# Patient Record
Sex: Female | Born: 1945 | Race: White | Hispanic: No
Health system: Southern US, Community
[De-identification: ages and names within clinical notes are randomized; demographics above are authoritative.]

---

## 2009-01-31 ENCOUNTER — Ambulatory Visit (HOSPITAL_COMMUNITY): Admission: RE | Admit: 2009-01-31 | Discharge: 2009-01-31 | Payer: Self-pay | Admitting: Interventional Radiology

## 2009-02-14 ENCOUNTER — Inpatient Hospital Stay (HOSPITAL_COMMUNITY): Admission: RE | Admit: 2009-02-14 | Discharge: 2009-02-16 | Payer: Self-pay | Admitting: Interventional Radiology

## 2009-02-28 ENCOUNTER — Encounter: Payer: Self-pay | Admitting: Interventional Radiology

## 2009-08-13 ENCOUNTER — Ambulatory Visit (HOSPITAL_COMMUNITY): Admission: RE | Admit: 2009-08-13 | Discharge: 2009-08-13 | Payer: Self-pay | Admitting: Interventional Radiology

## 2010-08-05 IMAGING — CR DG CHEST 2V
2 series · 2 of 2 positions shown · non-contrast
Comparison: None

CLINICAL DATA: Cerebral artery malformation, pre coiling.  Cough.
Hypertension.

CHEST - 2 VIEW

[view not recorded (1 of 2)]
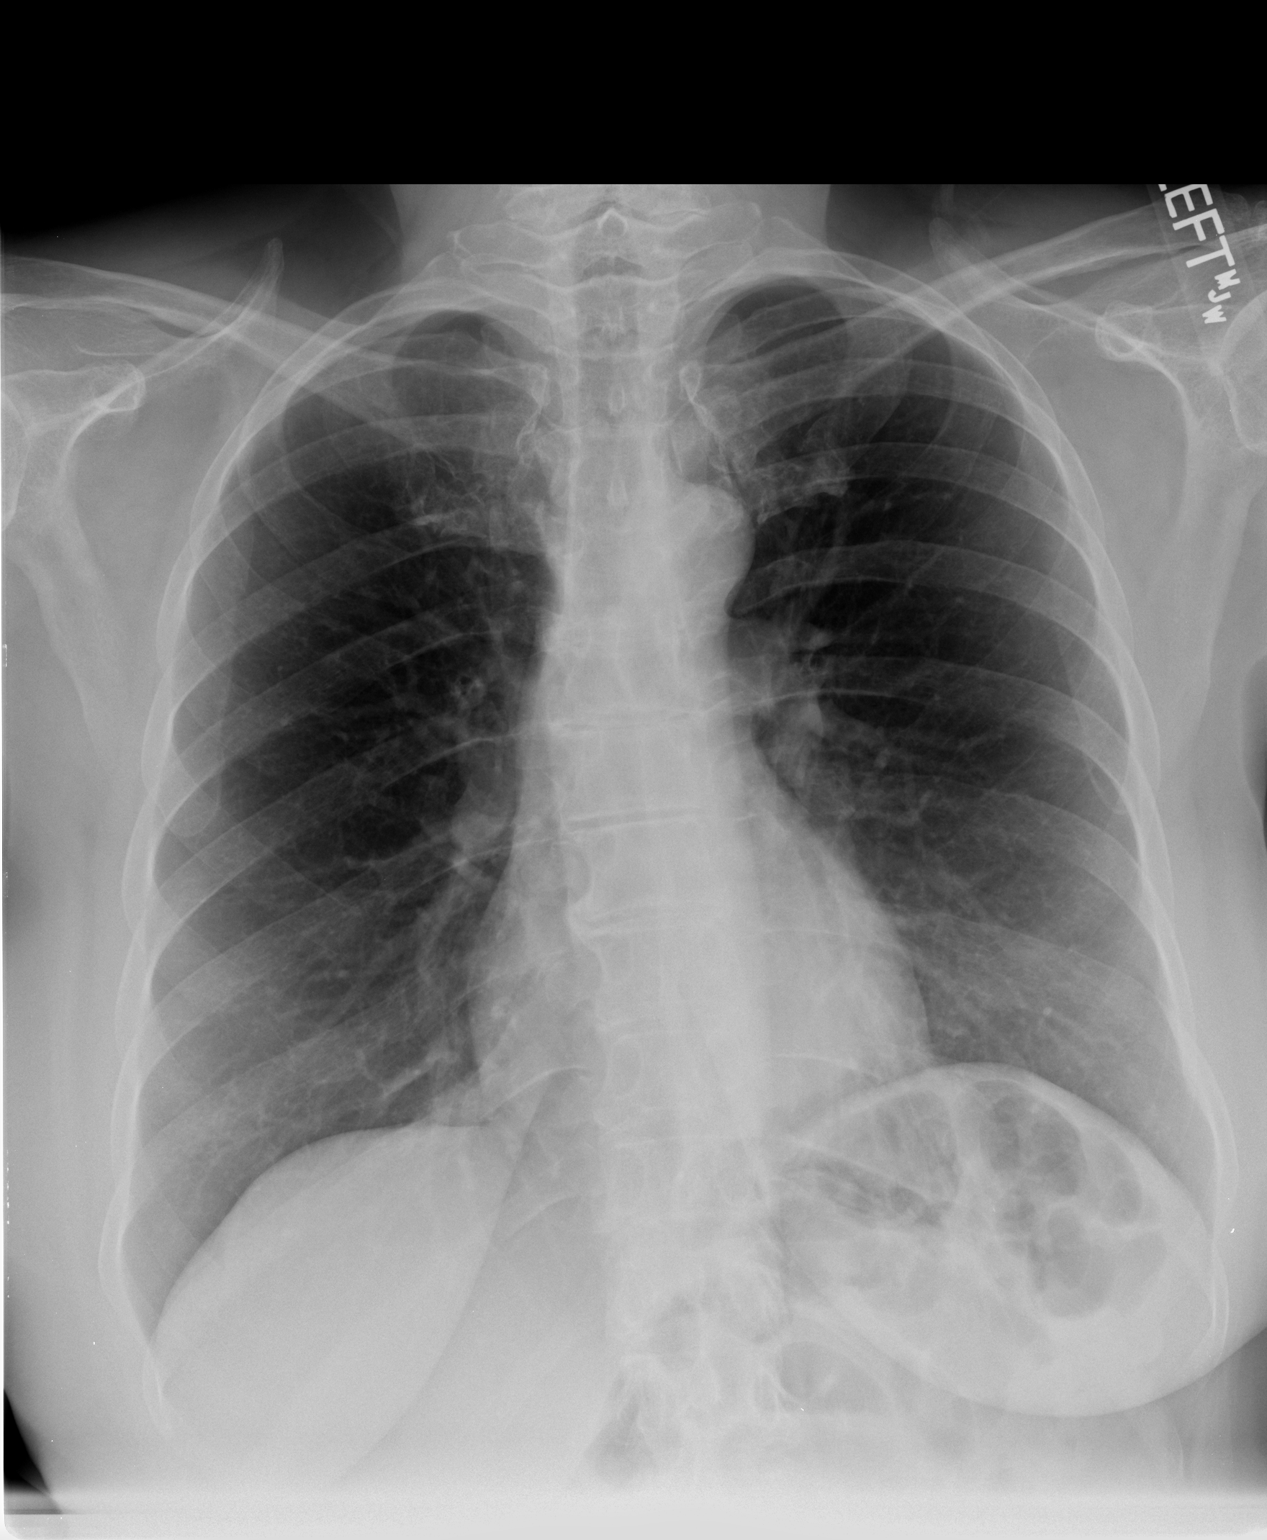

[view not recorded (2 of 2)]
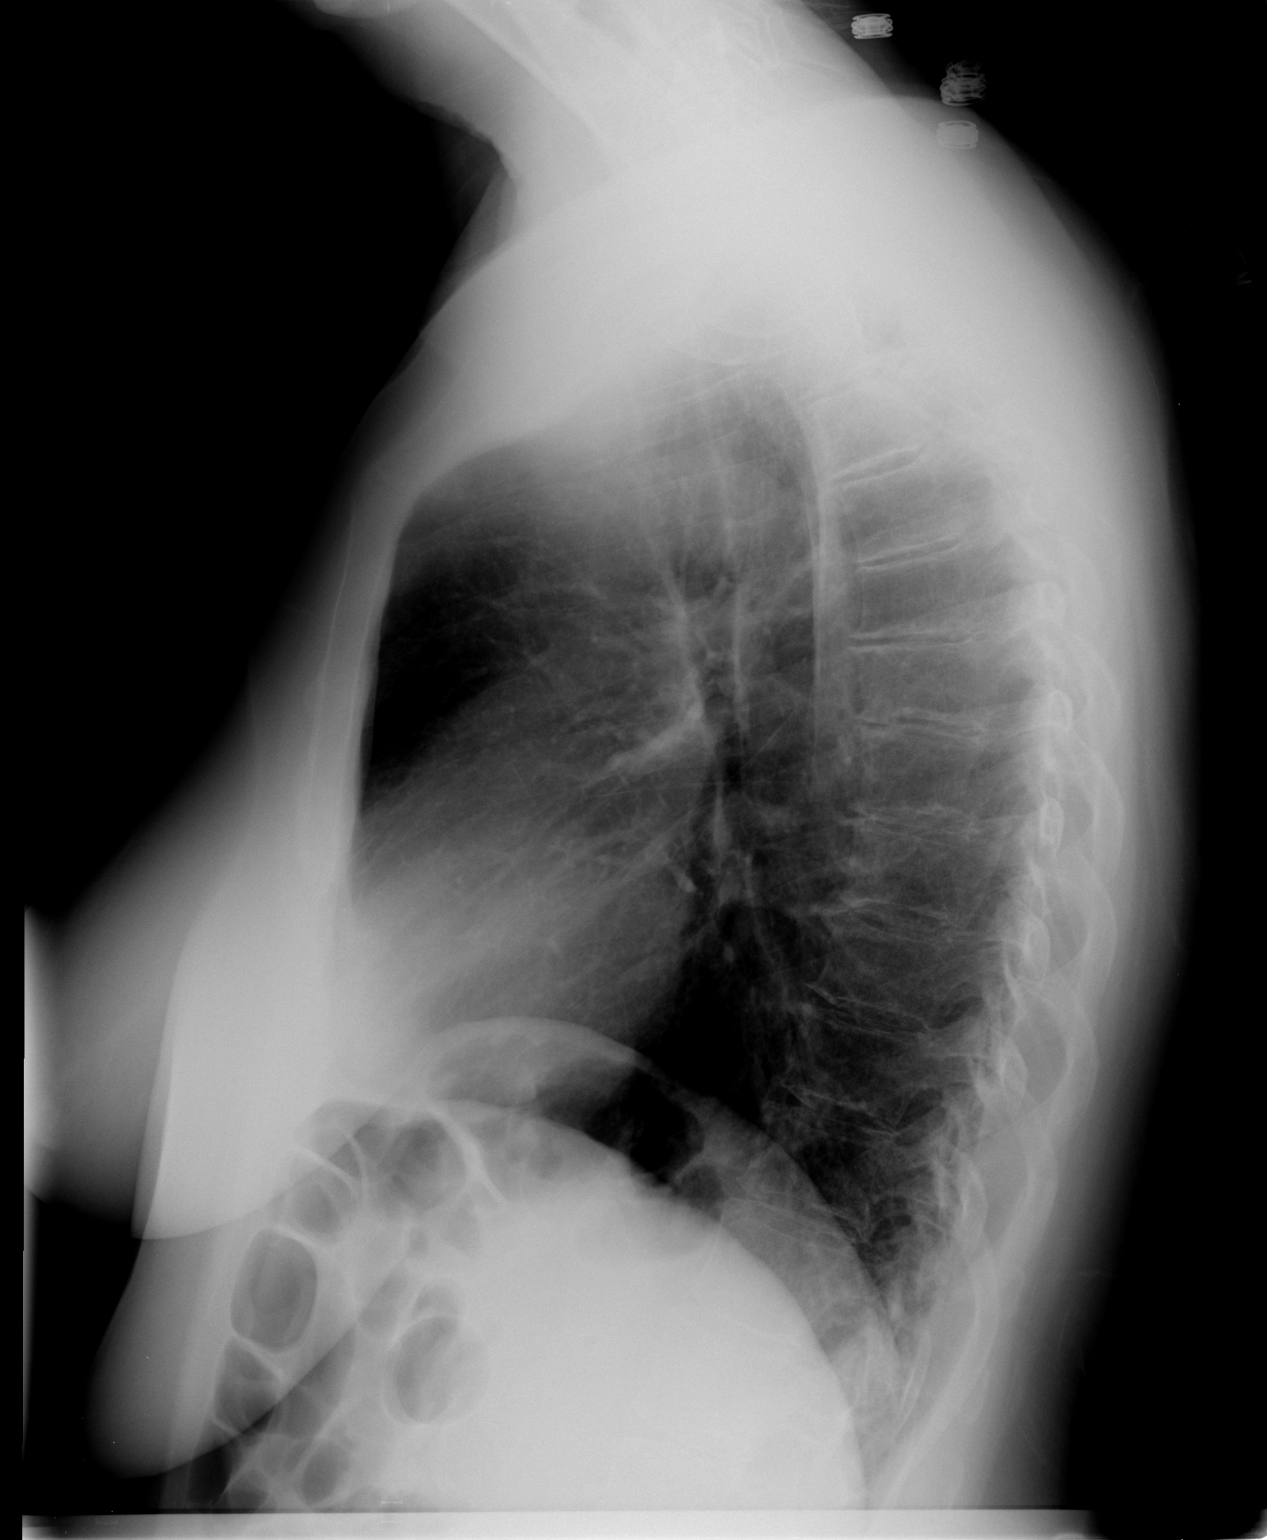

[2 of 2 positions shown; findings below may reference images not displayed]

FINDINGS: Cardiac and mediastinal contours appear unremarkable.

Airway thickening may reflect bronchitis or reactive airways
disease.  No airspace opacity is identified to suggest bacterial
pneumonia pattern.

No pleural effusion identified.  Mild thoracic spondylosis noted.
IMPRESSION: 1. Mild airway thickening may reflect bronchitis or reactive
airways disease.

## 2011-01-30 LAB — BUN: BUN: 9 mg/dL (ref 6–23)

## 2011-02-05 LAB — BASIC METABOLIC PANEL
BUN: 2 mg/dL — ABNORMAL LOW (ref 6–23)
BUN: 5 mg/dL — ABNORMAL LOW (ref 6–23)
CO2: 26 mEq/L (ref 19–32)
Calcium: 8.2 mg/dL — ABNORMAL LOW (ref 8.4–10.5)
Calcium: 10 mg/dL (ref 8.4–10.5)
Chloride: 109 mEq/L (ref 96–112)
Chloride: 113 mEq/L — ABNORMAL HIGH (ref 96–112)
Creatinine, Ser: 0.73 mg/dL (ref 0.4–1.2)
Creatinine, Ser: 0.91 mg/dL (ref 0.4–1.2)
GFR calc Af Amer: >60 mL/min (ref >60)
GFR calc Af Amer: >60 mL/min (ref >60)
Glucose, Bld: 102 mg/dL — ABNORMAL HIGH (ref 70–99)
Potassium: 4 mEq/L (ref 3.5–5.1)
Sodium: 141 mEq/L (ref 135–145)
Sodium: 141 mEq/L (ref 135–145)
Sodium: 142 mEq/L (ref 135–145)

## 2011-02-05 LAB — CBC
HCT: 29.5 % — ABNORMAL LOW (ref 36.0–46.0)
HCT: 40.5 % (ref 36.0–46.0)
Hemoglobin: 10.2 g/dL — ABNORMAL LOW (ref 12.0–15.0)
Hemoglobin: 9.9 g/dL — ABNORMAL LOW (ref 12.0–15.0)
Hemoglobin: 14 g/dL (ref 12.0–15.0)
MCHC: 34.5 g/dL (ref 30.0–36.0)
MCHC: 34.8 g/dL (ref 30.0–36.0)
MCV: 91.8 fL (ref 78.0–100.0)
MCV: 92.1 fL (ref 78.0–100.0)
Platelets: 210 10*3/uL (ref 150–400)
Platelets: 321 10*3/uL (ref 150–400)
RBC: 3.11 MIL/uL — ABNORMAL LOW (ref 3.87–5.11)
RBC: 3.2 MIL/uL — ABNORMAL LOW (ref 3.87–5.11)
RBC: 4.46 MIL/uL (ref 3.87–5.11)
RDW: 12.4 % (ref 11.5–15.5)
WBC: 4.9 10*3/uL (ref 4.0–10.5)

## 2011-02-05 LAB — DIFFERENTIAL
Basophils Absolute: 0 10*3/uL (ref 0.0–0.1)
Lymphs Abs: 1.3 10*3/uL (ref 0.7–4.0)

## 2011-02-05 LAB — GLUCOSE, CAPILLARY: Glucose-Capillary: 101 mg/dL — ABNORMAL HIGH (ref 70–99)

## 2011-02-05 LAB — PROTIME-INR: Prothrombin Time: 13.4 seconds (ref 11.6–15.2)

## 2011-03-11 NOTE — Consult Note (Signed)
[name], [name]                  ACCOUNT NO.:  [id number]   MEDICAL RECORD NO.:  [id number]          PATIENT TYPE:  OUT   LOCATION:  XRAY                         FACILITY:  [facility]   PHYSICIAN:  Sanjeev K. Deveshwar, M.D.DATE OF BIRTH:  1946/05/25   DATE OF CONSULTATION:  DATE OF DISCHARGE:                                 CONSULTATION   CHIEF COMPLAINT:  Pulsatile tinnitus with abnormal MRA.   HISTORY OF PRESENT ILLNESS:  This is a very pleasant 65 year old female  who developed pulsatile tinnitus in December 2009.  She does not  remember any specific trauma or injury or any other abnormal activities,  which may have lead to the symptoms.  She was evaluated by her primary  care physician.  Apparently, she was evaluated by an ear, nose, and  throat physician, as well as by Dr. Gary Fleet, who is a neurologist  in [place].  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  There was also question of a dissection.  The cerebral angiogram was recommended for further evaluation.  The  patient presents today, accompanied by her daughter, as well as a family  friend, to discuss further evaluation and treatment options.   PAST MEDICAL HISTORY:  The patient has been very healthy.  She does have  some mild hypertension.  She has a seizure disorder history, dating from  2001.  Apparently, she has only ever had one seizure.  She initially was  treated with Dilantin.  She is now on Lamictal.  She states that she had  an EEG at some point, and continued medical therapy was recommended.  The patient also has a history of B12 deficiency, and she does have  frequent headaches.  She has had pulsatile tinnitus since December.  She  recently had an upper respiratory tract infection.   SURGICAL HISTORY:  The patient had a bilateral tubal ligation in the  past.  She denies any previous problems  with anesthesia.   ALLERGIES:  No known drug allergies.   CURRENT MEDICATIONS:  1. Lamictal 75 mg b.i.d.  2. Toprol-XL 25 mg daily.  3. Lisinopril HCT 10/12.5 one daily.  She is on some vitamins and supplements, as well as B12 injections.   SOCIAL HISTORY:  The patient is married.  She has 1 daughter.  The  patient lives in [place], [place], which apparently is near  [place].  She has never smoked.  She does not use alcohol.  She  works doing Hospital doctor work.   FAMILY HISTORY:  Her mother died at age 67 from heart trouble.  Her  father died at age 9 from prostate cancer.   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  A cerebral angiogram was recommended.  The  cerebral angiogram was described in detail along with the risks and  benefits of the procedure.  Dr. Corliss Skains did review  the patient's  recent MRA with the patient and her daughter and friend.  Dr. Corliss Skains  felt that this was a venous fistula although he could not say with  certainty based on the limitations of the MRA.  He has recommended a  cerebral angiogram with possible treatment to include endovascular  stenting and/or coiling; however, the final treatment options cannot be  determined until after the angiogram has been performed.  Dr. Corliss Skains  did give the patient the option of having a cerebral angiogram performed  sometime next week or having the patient return in April to have the  cerebral angiogram with anesthesia standing by for possible intervention  to follow the angiogram if felt to be safe and indicated.  Again, the  risks and benefits of the procedures were described in detail.  All of  their questions were answered.  The patient was told to call if she had  any further questions, and she was told to go home and think about the  situation and to contact us  when once she had made a decision how to  proceed.  Dr. Corliss Skains did not feel the Plavix would be indicated  although if on the day of the procedure, a stent was needed, he would  load the patient with Plavix at that time.  He felt that he probably  would start aspirin at some point prior to the intervention.  We will  await the final word from the patient.   Greater than 60 minutes was spent on this patient's evaluation.      Delton See, P.A.    ______________________________  Grandville Silos. Corliss Skains, M.D.    DR/MEDQ  D:  01/10/2009  T:  01/11/2009  Job:  [id number]   cc:   Dr. Gary Fleet  Dr. Lajoyce Lauber

## 2011-03-11 NOTE — Discharge Summary (Signed)
[name], [name]                  ACCOUNT NO.:  [id number]   MEDICAL RECORD NO.:  [id number]          PATIENT TYPE:  INP   LOCATION:  3106                         FACILITY:  [facility]   PHYSICIAN:  Sanjeev K. Deveshwar, M.D.DATE OF BIRTH:  February 04, 1946   DATE OF ADMISSION:  02/14/2009  DATE OF DISCHARGE:  02/16/2009                               DISCHARGE SUMMARY   BRIEF HISTORY:  This is a pleasant 65 year old female who is referred to  Dr. Corliss Skains through the courtesy of Dr. 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  The patient initially presented with  pulsatile tinnitus and a left cervical bruit.  She was seen in  consultation by Dr. Corliss Skains on January 10, 2009, treatment options were  discussed, and arrangements were made to have the patient return to  [facility] on February 14, 2009, for endovascular treatment of  this venous fistula.   PAST MEDICAL HISTORY:  Significant for mild hypertension.  She has a  history of a seizure disorder dating back 2001.  She has only ever had 1  seizure.  She was on Dilantin in the past.  She is now on Lamictal.  She  had an EEG at some point and continued therapy was recommended.  She has  a history of a B12 deficiency.  She has frequent headaches.  She has had  pulsatile tinnitus since December 2009.   SURGICAL HISTORY:  Significant for a tubal ligation.  She denies any  previous problems with anesthesia.   ALLERGIES:  No known drug allergies.   MEDICATIONS AT THE TIME OF ADMISSION:  1. Lamictal 75 mg b.i.d.  2. Toprol-XL 20 mg daily.  3. Lisinopril HCT 10/12.5 one daily.  4. She is also on vitamin supplements and B12 injections.  5. She was started on aspirin just prior to admission.  6. She was also given Plavix on the day of admission in the      anticipation of possible stent placement.   SOCIAL HISTORY:  The patient is married.  She has 1 daughter.   She lives  in [place], [place], which is near [place], and she has never  smoked.  She does not use alcohol.  She works at [employer], corporate type work.   FAMILY HISTORY:  Her mother died at age 105 from heart trouble.  Her  father died at age 33 from prostate cancer.   A left occipital venous fistula, on the day of admission, she underwent  coiling, as well as Onyx obliteration of the fistula, performed under  general anesthesia by Dr. Corliss Skains.  There were no immediate or known  complications.  The patient tolerated the procedure well.  Afterwards,  she was admitted to the neuro intensive care unit where she remained  overnight.   The following day, the patient was noted to be somewhat anemic, which  was felt to be secondary to fluid hydration.  She was also hypokalemic,  which was supplemented.  The patient appeared to be very weak.  Her  voice was hoarse.  Dr. Corliss Skains did not feel comfortable with  discharging the patient today after the intervention.  She was kept an  additional day.  On February 16, 2009, the patient felt much better.  She  ambulated around the unit with assistance and did very well.  There were  no neurologic deficits noted.  Her potassium had improved, and  arrangements were made to discharge the patient in stable and improved  condition.   LABORATORY DATA:  On the day of discharge, a basic metabolic panel  revealed a BUN of 2, creatinine 0.73.  Her GFR was greater than 60.  Her  potassium was 3.8, chloride was 113, glucose was 102.  A CBC on the day  of discharge revealed hemoglobin 10.2, hematocrit 29.5, WBCs were 5000,  platelets were 210,000.  On the 22nd, her potassium had been low at 3.2  and this was supplemented and improved.  On January 29, 2009, her  hemoglobin had been 14, on February 09, 2009, that was 26.   DISCHARGE INSTRUCTIONS:  The patient was told to resume her normal  medications that she was taking prior to admission.  Please see the list   as noted above.  She was to stay on aspirin 81 mg for 1 week.   The patient was told to avoid any strenuous activity.  She was not to  drive for at least 1 or possibly 2 weeks.  She is not to return to work  for 2 weeks.  She was not to lift more than 10 pounds for 2 weeks.   The patient will return to see Dr. Corliss Skains in approximately 2 weeks.  She was told to follow up with Dr. Criss Alvine and Dr. Elenora Fender as needed or  as scheduled.  She was given instructions regarding her right groin  wound care.   DISCHARGE DIAGNOSES:  1. Left posterior occipital fistula, treated with coiling and Onyx      obliteration on February 14, 2009, by Dr. Corliss Skains under general      anesthesia.  The patient had a small residual feeder vessel, which      Dr. Corliss Skains was not able to obliterate.  He felt this vessel      would probably close on its own in time.  The patient still had      some pulsatile tinnitus at the time of discharge, as well as a left      posterior cervical bruits.  Otherwise, Dr. Corliss Skains felt that      there was a good result from the procedure.  2. Mild hypertension:  The patient's blood pressure medications were      held on the first postop day.  On the second postop day, her      lisinopril HCT was held and Toprol was continued, and she was told      to resume her normal medications after she was discharged.  3. Hypokalemia:  This was supplemented and improved.  4. Anemia:  Felt secondary to fluid hydration.  This should gradually      improve.  5. Seizure history.  6. Frequent headaches.  7. B12 deficiency.  8. Cerebral angiogram performed on January 31, 2009, which documented 2      fistulas.      Delton See, P.A.    ______________________________  Grandville Silos. Corliss Skains, M.D.    DR/MEDQ  D:  02/16/2009  T:  02/17/2009  Job:  [id number]   cc:   Dr. Chrissie Noa  Prince  Dr. Gary Fleet

## 2011-03-11 NOTE — Consult Note (Signed)
NAMETONANTZIN, MIMNAUGH                  ACCOUNT NO.:  [id number]   MEDICAL RECORD NO.:  [id number]          PATIENT TYPE:  OUT   LOCATION:  XRAY                         FACILITY:  [facility]   PHYSICIAN:  Sanjeev K. 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  Julieanne Cotton on February 14, 2009.   BRIEF HISTORY:  This is a pleasant 65 year old female who developed  pulsatile tinnitus in December of 2009.  The patient denied any specific  trauma or injury.  She was evaluated by her primary care physician, Dr.  Lajoyce Lauber, as well as an ear, nose and throat physician and  eventually referred to Dr. Gary Fleet, a neurologist in  [place].  The patient was noted to have a left cervical bruit.  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  There was a question of a dissection.  A  cerebral angiogram was recommended.  The patient was referred to Dr.  Corliss Skains through the courtesy of Dr. Elenora Fender.  Dr. Corliss Skains saw the  patient in consultation on January 10, 2009.  At that time cerebral  angiograms and possible treatment options were discussed.  A decision  was made to proceed with the cerebral angiogram which was performed by  Dr. Corliss Skains on January 31, 2009.  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  There  were two fistulous communications noted with one being proximal and  associated with a saccular outpouching projecting posteriorly and  inferiorly.  It was felt that this might represent an aneurysm or a  pseudoaneurysm.  The patient had a significantly attenuated left  vertebral artery proximal to the left posterior inferior cerebellar  artery  distal to the second fistulous communication.   These findings were discussed with the patient and her daughter.  Treatment options were described along with the risks and benefits.  The  patient was later admitted to [facility] on February 14, 2009, to  undergo treatment of the AV fistula.  On that day she had embolization  of the fistula performed under general anesthesia by Dr. Corliss Skains using  endovascular coils and ONYX.  The patient remained in the neuro-  intensive care unit for two nights following the procedure.  She was  discharged on February 16, 2009, in stable and improved condition.   During the patient's stay she was noted to be mildly anemic with a  hemoglobin of 10.2, hematocrit 29.5 on the day of discharge.  She was  also noted to be hypokalemic and this was supplemented and improved.  The patient was still noting some pulsatile tinnitus at the time of  discharge.  She also had a residual bruit in this area.  Dr. Corliss Skains  felt that this was secondary to a small feeder vessel which he  was  unable to obliterate.  He felt that this would close off on its own in  time.   The patient returns today on Feb 28, 2009, to be seen in followup.   PAST MEDICAL HISTORY:  Is significant for mild hypertension.  She was  hypotensive during her stay and some of her blood pressure medications  had to be held.  She has a seizure disorder dating back to 2001.  Apparently she has only ever had one seizure but she has had one or two  EEGs after which continued therapy was recommended.  She was on Dilantin  in the past.  She is now on Lamictal and she has had no further  seizures.  She has a history of B12 deficiency.  She has frequent  headaches.  She had pulsatile tinnitus since December of 2009.   PAST SURGICAL HISTORY:  Significant for tubal ligation.   ALLERGIES:  No known drug allergies.   MEDICATIONS:  1. Include Lamictal 75 mg b.i.d.  2. Toprol XL 20 mg daily.  3.  Lisinopril HCT 10/12.5 one daily.  4. She takes multivitamins and B12.  5. At time of discharge it was recommended that she remain on aspirin      81 mg daily for 5 days.  She is now off this medication.  6. She was also given Plavix just prior to her intervention but this      was not continued at the time of discharge.   SOCIAL HISTORY:  The patient is married.  She has one daughter.  She  lives in [place], [place].  She has never smoked.  She does not  use alcohol.  She works doing Investment banker, corporate type work.   FAMILY HISTORY:  Her mother died at age 83 from heart trouble, her  father died at age 79 from prostate cancer.   IMPRESSION AND PLAN:  The patient returns today for further evaluation  after undergoing a venous fistula embolization on February 14, 2009, as  described above.  The patient reports that she is no longer experiencing  pulsatile tinnitus.  This stopped several days after discharge from the  hospital.  She has continued to have occasional headaches as well as an  occasional shooting pain through her head.  She reports mild dyspnea on  exertion and appeared to be somewhat deconditioned.  She feels that her  neck and the occipital area of her head are still mildly tender to  palpation.  She has had some intermittent dizziness and balance  problems.  She reports no visual problems.   On exam the bruit is no longer present.  The patient did experience some  mild dizziness when she would stand and close her eyes.  Cerebellar  testing with finger-to-nose was intact.  We had the nurse check her  blood pressure sitting and standing; sitting her blood pressure is  137/77, pulse was 69.  Standing her blood pressure is 134/82 with a  pulse of 86.   Dr. Corliss Skains reviewed the images from the procedure with the patient.  He pointed out the fistula and described the embolization procedure for  the patient once again.  It was felt that the intervention was a success  and  hopefully there should be no recurrence.   We did recommend that the patient follow up with her primary care  physician, Dr. Criss Alvine, in 1-2 weeks for a repeat CBC for further  monitoring of her anemia.  We will also encourage her to  drink plenty of  fluids.  The patient was told that she could return to work on May 17  with no restrictions at that time.  Until then she is to stay out of  work.  She has not been driving or doing anything strenuous.  Dr.  Corliss Skains gave her permission to gradually increase her activities.   We plan to repeat an MRI/MRA with and without contrast in approximately  3 months which will be followed by another visit with Dr. Corliss Skains to  further monitor the patient's condition.   Greater than 25 minutes was spent on this visit.      Delton See, P.A.    ______________________________  Grandville Silos. Corliss Skains, M.D.    DR/MEDQ  D:  02/28/2009  T:  02/28/2009  Job:  [id number]   cc:   Dr Lajoyce Lauber  Dr Gary Fleet

## 2011-03-11 NOTE — H&P (Signed)
NAMEJALYSSA, [name]                  ACCOUNT NO.:  [id number]   MEDICAL RECORD NO.:  [id number]          PATIENT TYPE:  OIB   LOCATION:  3172                         FACILITY:  [facility]   PHYSICIAN:  Sanjeev K. Deveshwar, M.D.DATE OF BIRTH:  January 26, 1946   DATE OF ADMISSION:  02/14/2009  DATE OF DISCHARGE:                              HISTORY & PHYSICAL   CHIEF COMPLAINT:  Cerebral aneurysm.   HISTORY OF PRESENT ILLNESS:  This is a very pleasant 65 year old female  who was referred to Dr. Corliss Skains through the courtesy of Dr. Gary Fleet, a neurologist in [place], after the patient was found to  have a possible venous fistula in the left posterior occipital region.  This was found after the patient presented with pulsatile tinnitus and a  left cervical bruit.  The patient was seen in consultation by Dr.  Corliss Skains on January 10, 2009.  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  There were 2 fistulous  communications noted with the proximal one being associated with a  saccular outpouching projecting posteriorly and inferiorly.  It was felt  that this could represent a true aneurysm or a pseudoaneurysm related to  a previous trauma.  The patient was also noted to have a significantly  attenuated left vertebral artery proximal to the left posterior inferior  cerebellar artery distal to the second fistulous communication.  A  decision was made to have the patient return to [facility] for  endovascular treatment of the arteriovenous malformation.  The patient  is admitted today to undergo intervention.   PAST MEDICAL HISTORY:  Significant for:  1. Mild hypertension.  2. She has a history of a seizure disorder dating back to 2001.  She      has only ever had 1 seizure.  She was on Dilantin in the past.  She      is now on Lamictal.  She had an EEG at some point and continued      therapy was recommended.  3. She has a history of a B12  deficiency.  4. She has frequent headaches.  5. She has had pulsatile tinnitus since December of 2009.   SURGICAL HISTORY:  Significant for bilateral tubal ligation.  She denies  any previous problems with anesthesia.   ALLERGIES:  NO KNOWN DRUG ALLERGIES.   MEDICATIONS AT TIME OF ADMISSION:  Included:  1. Lamictal 75 mg b.i.d.  2. Toprol XL 25 mg daily.  3. Lisinopril HCT 10/12.5 one daily.  4. She also takes vitamins and supplements and B12 injections.  5. She was started on aspirin prior to the intervention.  6. She was also given Plavix on the day of admission for possible      stenting.   SOCIAL HISTORY:  The patient is married.  She has 1 daughter.  The  patient lives in [place], [place], which is near [place].  She has never smoked.  She does not use alcohol.  She works doing  Hospital doctor work.   FAMILY HISTORY:  Her mother died at age 17 from  heart trouble.  Her  father died at age 31 from prostate cancer.   REVIEW OF SYSTEMS:  Completely negative except for a recent cough which  she attributes to seasonal allergies.  She also reports that she bruises  easily.  The remainder of the review of systems is negative.   LABORATORY DATA:  An INR was 1.0.  A pro time was 13.4, PTT was 26, BUN  was 8, creatinine was 1.08, potassium was 4, hemoglobin was 14,  hematocrit 40.5, WBCs 4.5 thousand, platelets 308,000.   PHYSICAL EXAM:  Revealed a pleasant, alert, 65 year old female who  appeared younger than her stated age.  VITAL SIGNS:  Blood pressure 151/85.  Pulse 77.  Respirations 18.  Temperature 97.7.  Oxygen saturation 99% on room air.  HEENT:  Unremarkable.  NECK:  Significant for a left-sided venous-type bruit.  HEART:  Revealed regular rate and rhythm without murmur.  LUNGS:  Clear.  ABDOMEN:  Soft, nontender.  EXTREMITIES:  Reveal pulses to be intact.  Her airway was rated at a 1.  Her ASA scale was rated at a 2.  NEUROLOGICAL EXAM:  Within normal  limits.  Cranial nerves II-XII are  grossly intact.  Sensation was intact to light touch.  Motor strength is  5/5 throughout.  Cerebellar testing was intact.   ADMISSION DIAGNOSES:  1. 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  2. Mild hypertension.  3. Seizure history.  4. Frequent headaches.  5. B12 deficiency.  6. Cerebral angiogram performed January 31, 2009.  Please see report as      noted above.   PLAN:  As noted, the patient has been admitted to [facility]  today for arteriovenous malformation and a possible cerebral aneurysm if  felt to be safe and indicated.      Delton See, P.A.    ______________________________  Grandville Silos. Corliss Skains, M.D.    DR/MEDQ  D:  02/14/2009  T:  02/14/2009  Job:  [id number]   cc:   Gary Fleet, M.D.  Lajoyce Lauber, M.D.

## 2015-08-14 ENCOUNTER — Other Ambulatory Visit (HOSPITAL_COMMUNITY): Payer: Self-pay | Admitting: Interventional Radiology

## 2015-08-14 DIAGNOSIS — Q2730 Arteriovenous malformation, site unspecified: Secondary | ICD-10-CM

## 2015-08-28 ENCOUNTER — Ambulatory Visit (HOSPITAL_COMMUNITY): Payer: Self-pay

## 2015-09-04 ENCOUNTER — Ambulatory Visit (HOSPITAL_COMMUNITY)
Admission: RE | Admit: 2015-09-04 | Discharge: 2015-09-04 | Disposition: A | Discharge status: Home / Self Care | Payer: Medicare Other | Source: Ambulatory Visit | Attending: Interventional Radiology | Admitting: Interventional Radiology

## 2015-09-04 DIAGNOSIS — Q2730 Arteriovenous malformation, site unspecified: Secondary | ICD-10-CM

## 2015-09-05 ENCOUNTER — Other Ambulatory Visit (HOSPITAL_COMMUNITY): Payer: Self-pay | Admitting: Interventional Radiology

## 2015-09-05 DIAGNOSIS — Q2730 Arteriovenous malformation, site unspecified: Secondary | ICD-10-CM

## 2015-09-18 ENCOUNTER — Other Ambulatory Visit: Payer: Self-pay | Admitting: Radiology

## 2015-09-19 ENCOUNTER — Other Ambulatory Visit: Payer: Self-pay | Admitting: Radiology

## 2015-09-21 ENCOUNTER — Ambulatory Visit (HOSPITAL_COMMUNITY)
Admission: RE | Admit: 2015-09-21 | Discharge: 2015-09-21 | Disposition: A | Discharge status: Home / Self Care | Payer: Medicare Other | Source: Ambulatory Visit | Attending: Interventional Radiology | Admitting: Interventional Radiology

## 2015-09-21 ENCOUNTER — Other Ambulatory Visit (HOSPITAL_COMMUNITY): Payer: Self-pay | Admitting: Interventional Radiology

## 2015-09-21 DIAGNOSIS — Q2730 Arteriovenous malformation, site unspecified: Secondary | ICD-10-CM

## 2015-09-21 DIAGNOSIS — Z87728 Personal history of other specified (corrected) congenital malformations of nervous system and sense organs: Secondary | ICD-10-CM | POA: Diagnosis not present

## 2015-09-21 DIAGNOSIS — I6502 Occlusion and stenosis of left vertebral artery: Secondary | ICD-10-CM | POA: Insufficient documentation

## 2015-09-21 DIAGNOSIS — Z09 Encounter for follow-up examination after completed treatment for conditions other than malignant neoplasm: Secondary | ICD-10-CM | POA: Insufficient documentation

## 2015-09-21 DIAGNOSIS — Z7982 Long term (current) use of aspirin: Secondary | ICD-10-CM | POA: Diagnosis not present

## 2015-09-21 LAB — PROTIME-INR
INR: 0.98 (ref 0.00–1.49)
PROTHROMBIN TIME: 13.2 s (ref 11.6–15.2)

## 2015-09-21 LAB — CBC
HCT: 40.7 % (ref 36.0–46.0)
HEMOGLOBIN: 13.6 g/dL (ref 12.0–15.0)
MCH: 31 pg (ref 26.0–34.0)
MCHC: 33.4 g/dL (ref 30.0–36.0)
MCV: 92.7 fL (ref 78.0–100.0)
Platelets: 246 10*3/uL (ref 150–400)
RBC: 4.39 MIL/uL (ref 3.87–5.11)
RDW: 13.7 % (ref 11.5–15.5)
WBC: 5.1 10*3/uL (ref 4.0–10.5)

## 2015-09-21 LAB — BASIC METABOLIC PANEL
ANION GAP: 8 (ref 5–15)
BUN: 10 mg/dL (ref 6–20)
CHLORIDE: 106 mmol/L (ref 101–111)
CO2: 26 mmol/L (ref 22–32)
Calcium: 9.3 mg/dL (ref 8.9–10.3)
Creatinine, Ser: 0.92 mg/dL (ref 0.44–1.00)
GFR calc non Af Amer: >60 mL/min (ref >60)
Glucose, Bld: 113 mg/dL — ABNORMAL HIGH (ref 65–99)
Potassium: 3.7 mmol/L (ref 3.5–5.1)
Sodium: 140 mmol/L (ref 135–145)

## 2015-09-21 LAB — APTT: aPTT: 28 seconds (ref 24–37)

## 2015-09-21 MED ORDER — FENTANYL CITRATE (PF) 100 MCG/2ML IJ SOLN
INTRAMUSCULAR | Status: AC | PRN
  Administered 2015-09-21 (×2): 25 ug via INTRAVENOUS

## 2015-09-21 MED ORDER — LIDOCAINE HCL 1 % IJ SOLN
INTRAMUSCULAR | Status: AC
  Filled 2015-09-21: qty 20

## 2015-09-21 MED ORDER — HEPARIN SODIUM (PORCINE) 1000 UNIT/ML IJ SOLN
INTRAMUSCULAR | Status: AC
  Filled 2015-09-21: qty 1

## 2015-09-21 MED ORDER — MIDAZOLAM HCL 2 MG/2ML IJ SOLN
INTRAMUSCULAR | Status: AC | PRN
  Administered 2015-09-21: 1 mg via INTRAVENOUS

## 2015-09-21 MED ORDER — IOHEXOL 300 MG/ML  SOLN
150.0000 mL | Freq: Once | INTRAMUSCULAR | Status: AC | PRN
  Administered 2015-09-21: 70 mL via INTRAVENOUS

## 2015-09-21 MED ORDER — SODIUM CHLORIDE 0.9 % IV SOLN: INTRAVENOUS | Status: AC

## 2015-09-21 MED ORDER — FENTANYL CITRATE (PF) 100 MCG/2ML IJ SOLN
INTRAMUSCULAR | Status: AC
  Filled 2015-09-21: qty 2

## 2015-09-21 MED ORDER — HEPARIN SODIUM (PORCINE) 1000 UNIT/ML IJ SOLN
INTRAMUSCULAR | Status: AC | PRN
  Administered 2015-09-21: 1000 [IU] via INTRAVENOUS

## 2015-09-21 MED ORDER — MIDAZOLAM HCL 2 MG/2ML IJ SOLN
INTRAMUSCULAR | Status: AC
  Filled 2015-09-21: qty 2

## 2015-09-21 MED ORDER — SODIUM CHLORIDE 0.9 % IV SOLN
Freq: Once | INTRAVENOUS | Status: AC
  Administered 2015-09-21: 08:00:00 via INTRAVENOUS

## 2015-09-21 NOTE — Sedation Documentation (Signed)
ETCO2 removed per Dr. Deveshwar 

## 2015-09-21 NOTE — H&P (Signed)
HPI:  The patient has had a H&P performed within the last 30 days, all history, medications, and exam have been reviewed. The patient denies any interval changes since the H&P.  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   She was then lost to follow-up until she went to see her cardiologist a few days ago on account of palpitations and chest pains. At that time she was found to have palpitation related to tachycardia and also atrial fibrillation.   Prior to starting her on anticoagulation, clearance for the arteriovenous fistula has been requested by the cardiologist.  Clinically she reports no neurological symptoms of visual aberrations, speech difficulties, fainting, seizures, left-sided hearing loss, pulsatile tinnitus, or proptosis of her left eye.  She has no motor sensory coordination difficulties.  She denies any recent illness, fever, chills.   Medications: Prior to Admission medications   Medication Sig Start Date End Date Taking? Authorizing Provider  aspirin 81 MG tablet Take 81 mg by mouth daily. 08/14/04  Yes Historical Provider, MD  lamoTRIgine (LAMICTAL) 150 MG tablet Take 150 mg by mouth daily.  04/05/15  Yes Historical Provider, MD  lisinopril-hydrochlorothiazide (PRINZIDE,ZESTORETIC) 10-12.5 MG tablet Take 1 tablet by mouth daily.  04/05/15  Yes Historical Provider, MD  metoprolol succinate (TOPROL XL) 50 MG 24 hr tablet Take 75 mg by mouth daily.  06/19/15  Yes Historical Provider, MD     Vital Signs: BP 150/82 mmHg  Pulse 64  Temp(Src) 98.4 F (36.9 C) (Oral)  Resp 10  Ht 5\' 8"  (1.727 m)  Wt 175 lb (79.379 kg)  BMI 26.61 kg/m2  SpO2 100%  Physical Exam  Constitutional: She is oriented to person, place, and time. She appears well-developed and well-nourished.  HENT:  Head: Atraumatic.  Eyes: EOM are normal.  Neck: Normal  range of motion. Neck supple.  Cardiovascular: Normal rate, regular rhythm and normal heart sounds.   No murmur heard. Abdominal: Soft. Bowel sounds are normal. She exhibits no distension. There is no tenderness.  Musculoskeletal: Normal range of motion.  Neurological: She is alert and oriented to person, place, and time.  Skin: Skin is warm and dry.  Psychiatric: She has a normal mood and affect. Her behavior is normal. Judgment and thought content normal.  Vitals reviewed.     Mallampati Score:  MD Evaluation Airway: WNL Heart: WNL Abdomen: WNL Chest/ Lungs: WNL ASA  Classification: 2 Mallampati/Airway Score: One  Labs:  CBC:  Recent Labs  09/21/15 0754  WBC 5.1  HGB 13.6  HCT 40.7  PLT 246    COAGS:  Recent Labs  09/21/15 0754  INR 0.98  APTT 28    BMP:  Recent Labs  09/21/15 0754  NA 140  K 3.7  CL 106  CO2 26  GLUCOSE 113*  BUN 10  CALCIUM 9.3  CREATININE 0.92  GFRNONAA >60  GFRAA >60    LIVER FUNCTION TESTS: No results for input(s): BILITOT, AST, ALT, ALKPHOS, PROT, ALBUMIN in the last 8760 hours.  Assessment/Plan:  According to Dr. 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  Dr. Corliss Skainseveshwar informed her that the most accurate way of evaluating the status of the fistula was to do a catheter diagnostic arteriogram. Other possibilities considered was a CT angiogram which  is felt to be not as sensitive.   Will proceed with Dx. Arteriogram today.  Risks and Benefits discussed with the patient including, but not limited to bleeding, infection, vascular injury or contrast induced renal failure.  All of the patient's questions were answered, patient is agreeable to proceed. Consent signed and in chart.   Signed: Gwynneth Macleod PA-C 09/21/2015, 8:52 AM

## 2015-09-21 NOTE — Sedation Documentation (Signed)
Patient is resting comfortably. 

## 2015-09-21 NOTE — Sedation Documentation (Signed)
5 fr. Exoseal to right groin 

## 2015-09-21 NOTE — Discharge Instructions (Signed)

## 2015-09-21 NOTE — Procedures (Signed)
S/P bilateral common carotid ,rt vert artery ,lt subclavian arteriograms  and arch aortogram.. Rt CFa approach . Findings. 1.No angioevidence of residual or recurrent AV shunting

## 2017-03-25 IMAGING — XA IR ANGIO INTRA EXTRACRAN SEL COM CAROTID INNOMINATE BILAT MOD SE
1 series · 13 of 24 positions shown · IV contrast (IODINE)
Comparison: none

CLINICAL DATA: History of a dural AV fistula status post
embolization.

[Series 300: dr. (person_name) · 13 of 138 slices shown]
[im 1/138]
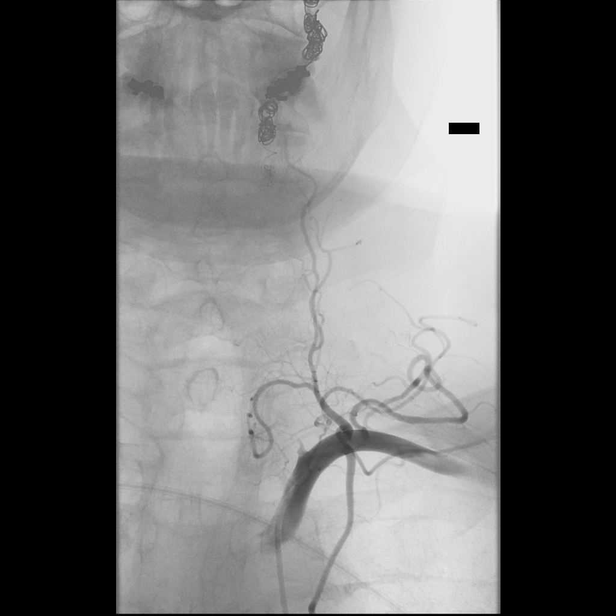
[im 12/138]
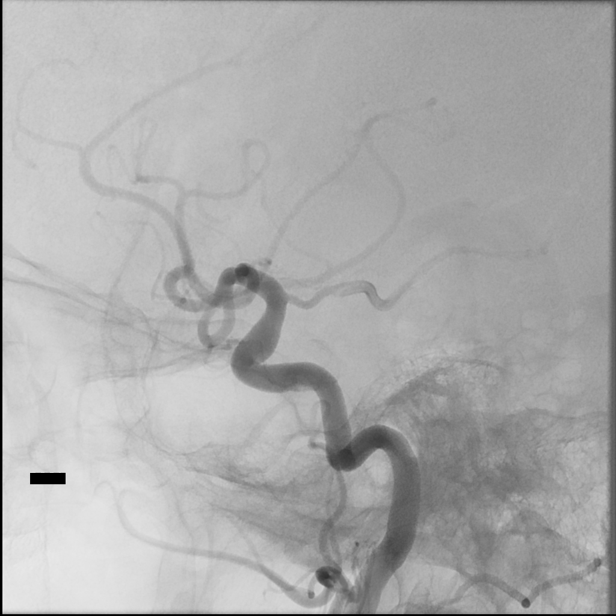
[im 24/138]
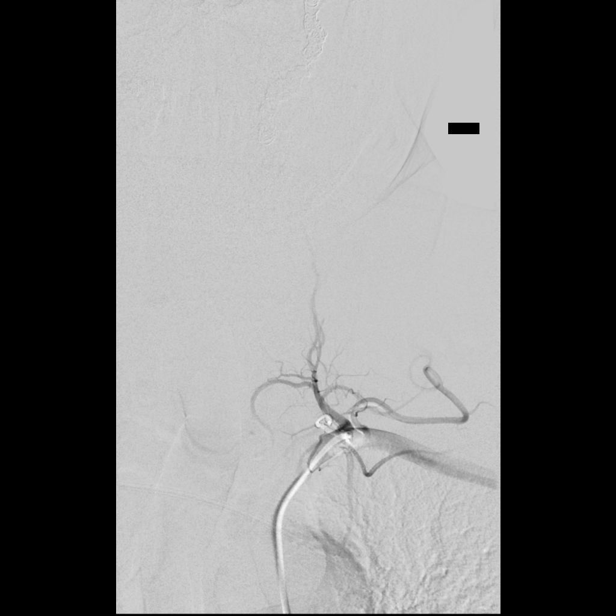
[im 36/138]
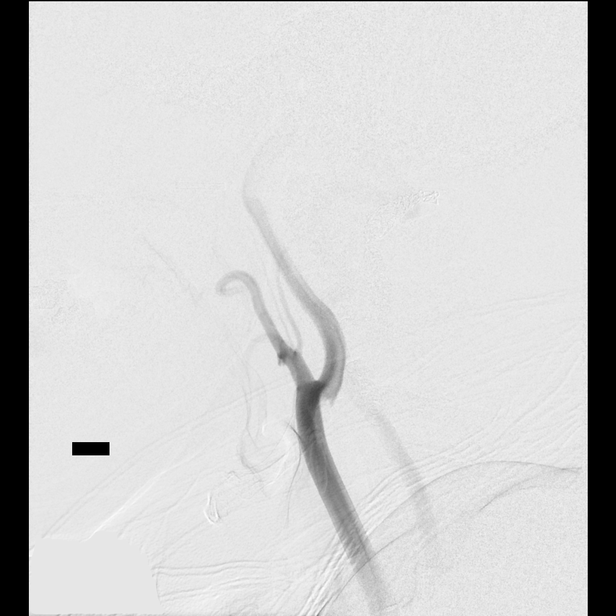
[im 48/138]
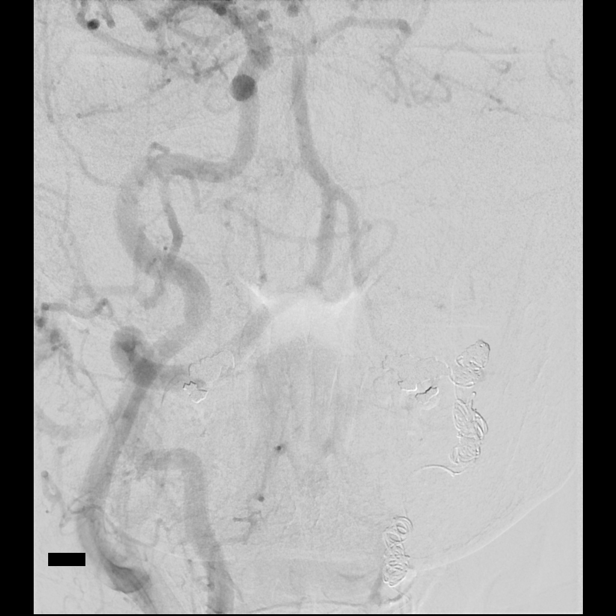
[im 60/138]
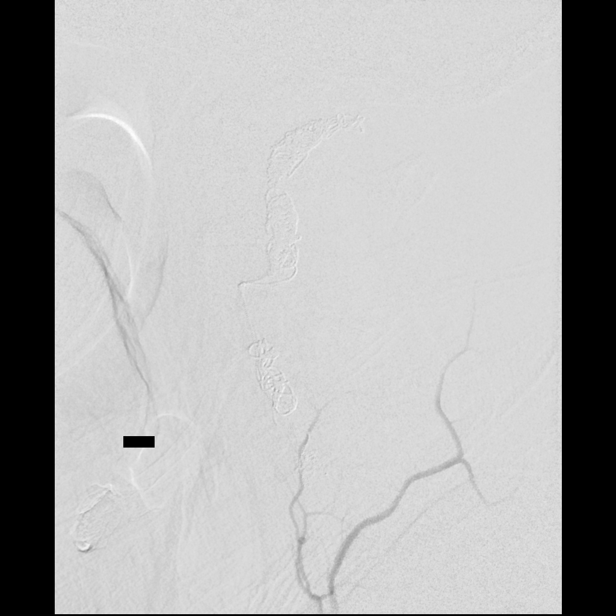
[im 72/138]
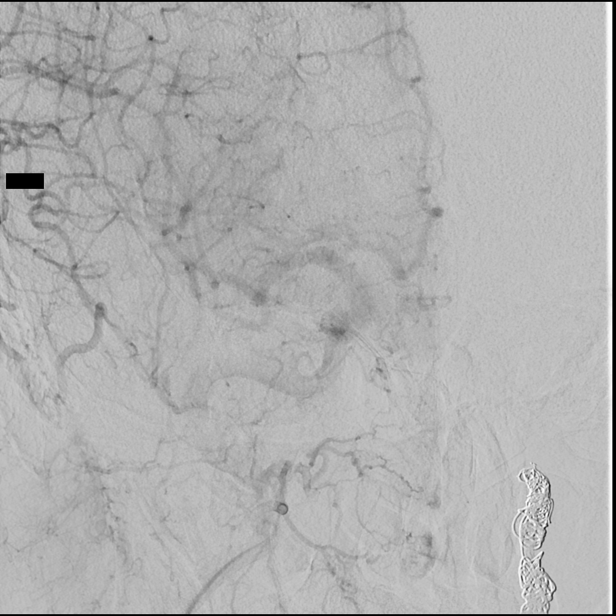
[im 78/138]
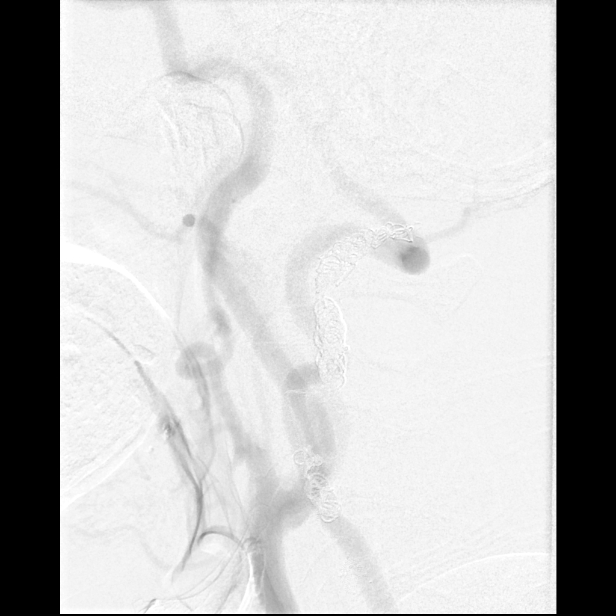
[im 90/138]
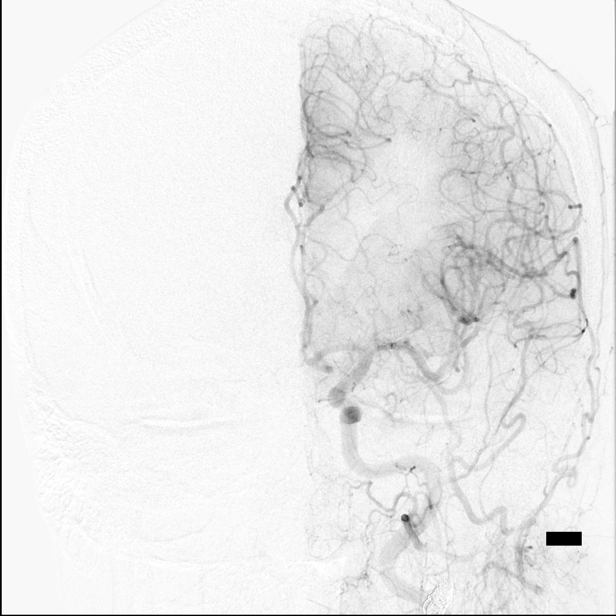
[im 102/138]
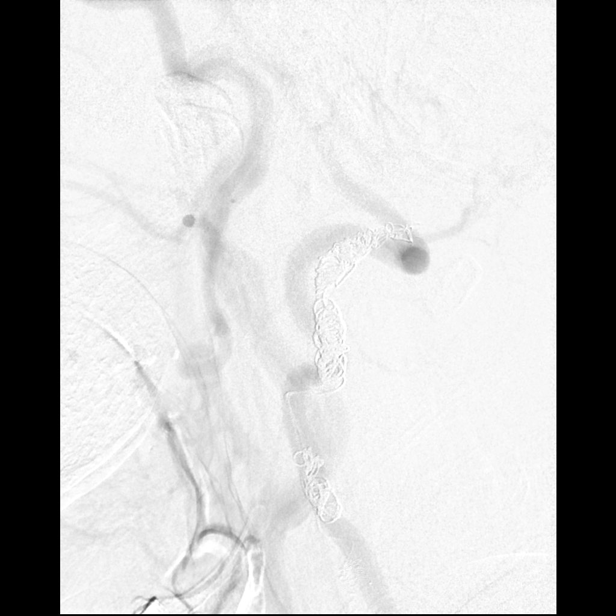
[im 114/138]
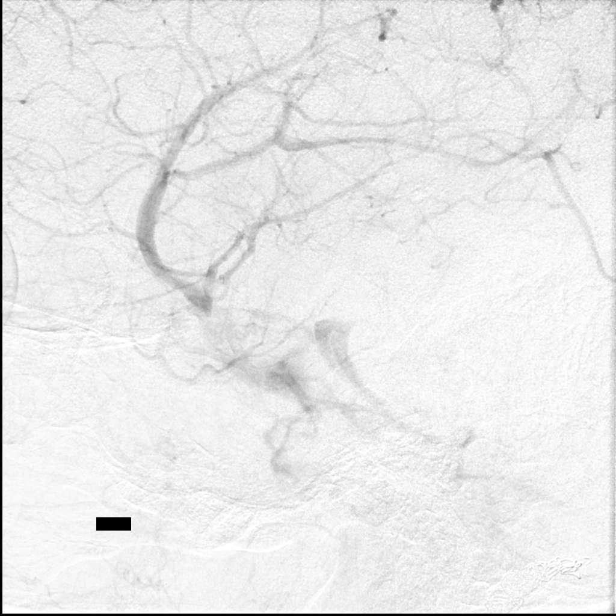
[im 126/138]
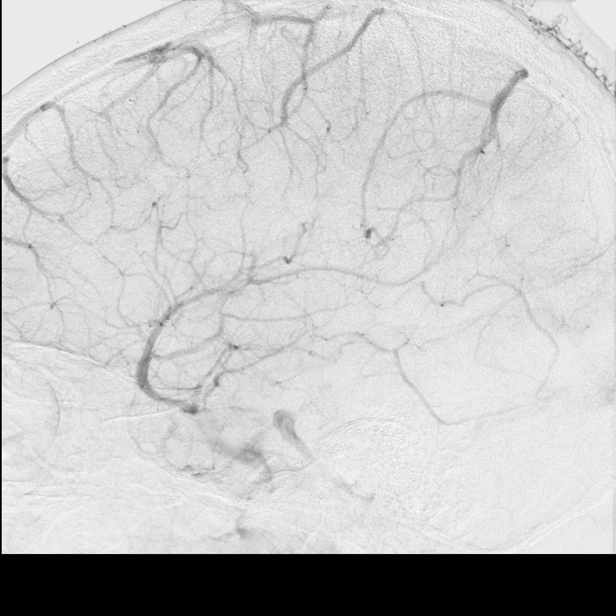
[im 138/138]
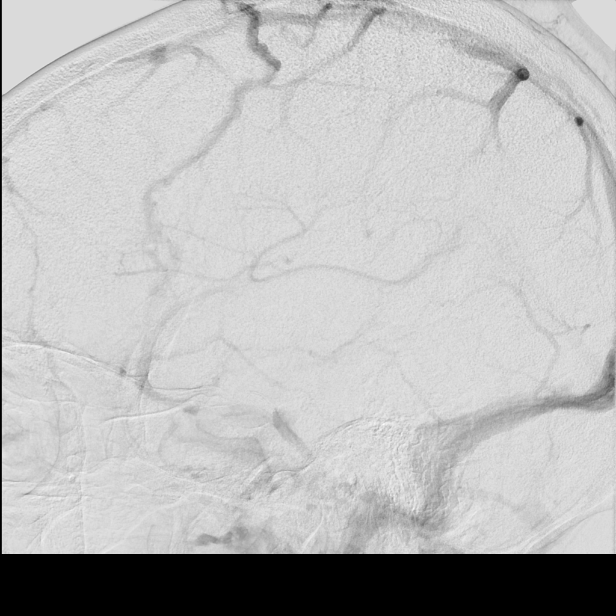

[13 of 24 positions shown; findings below may reference images not displayed]

EXAM:
BILATERAL COMMON CAROTID AND INNOMINATE ANGIOGRAPHY AND RIGHT
VERTEBRAL ARTERY ANGIOGRAMS, LEFT SUBCLAVIAN ARTERIOGRAM, AND ARCH
AORTOGRAM:

PROCEDURE:
Contrast: 70mL OMNIPAQUE IOHEXOL 300 MG/ML  SOLN

Anesthesia/Sedation:  Conscious sedation.

Medications: Versed at 1 mg IV.  Fentanyl 50 mcg IV.

Following a full explanation of the procedure along with the
potential associated complications, an informed witnessed consent
was obtained.

The right groin was prepped and draped in the usual sterile fashion.
Thereafter using modified Seldinger technique, transfemoral access
into the right common femoral artery was obtained without
difficulty. Over a 0.035 inch guidewire, a 5 French Pinnacle sheath
was inserted. Through this, and also over a 0.035 inch guidewire, a
5 French JB1 catheter was advanced to the aortic arch region and
selectively positioned in the right common carotid artery, the right
subclavian artery, the left common carotid artery and left
subclavian artery.

There were no acute complications. The patient tolerated the
procedure well.
FINDINGS: The right common carotid arteriogram demonstrates the right external
carotid artery and its major branches to be widely patent.

The right internal carotid artery at the bulb to the cranial skull
base opacifies normally.

The petrous and the proximal cavernous segments are widely patent.

There is moderate fusiform dilatation of the proximal supraclinoid
segment extending from the origin of the ophthalmic artery to the
origin of the posterior communicating artery.

The supraclinoid segment distally is normally patent.

The right middle and the right anterior cerebral arteries opacify
normally into the capillary and the venous phases.

The right vertebral artery origin is normal.

The vessel is seen to opacify normally to the cranial skull base.
Normal opacification is seen of the right posterior inferior
cerebellar artery and the right vertebrobasilar junction.

The basilar artery, the posterior cerebral arteries, superior
cerebellar arteries and the anterior-inferior cerebellar arteries
are seen to opacify normally into the capillary and the venous
phases. There is a retrograde opacification of the left
vertebrobasilar junction to the origin of left posterior inferior
cerebellar artery. No antegrade clearance of contrast is seen from
the more proximal left vertebral artery.

The left common carotid arteriogram demonstrates the left external
carotid artery and its major branches to be widely patent.

The left internal carotid artery at the bulb to the cranial skull
base opacifies normally.

The petrous, the cavernous and the supraclinoid segments are widely
patent.

The left middle and the left anterior cerebral arteries opacify
normally into the capillary and the venous phases.

The left subclavian arteriogram demonstrates angiographically
occluded left vertebral artery with a stump. There is no distal
reconstitution from the ascending cervical branch of the ipsilateral
thyrocervical trunk.

The previously positioned coils in the occluded left distal
vertebral artery remains stable.

An arch aortogram demonstrates normal origins of the innominate
artery, the left common carotid artery and left subclavian arteries.
There is no angiographic evidence of a left vertebral artery arising
from the aortic arch.
IMPRESSION: Angiographically completely occluded left neck dural AV fistula.

Therapeutically occluded left vertebral artery at the skull base
stable.

Moderate fusiform dilatation of the right internal carotid artery
supraclinoid segment.

The angiographic findings were reviewed with the patient and
patient's daughter. She was advised to continue taking her
antihypertensives. Also a follow-up CT angiogram of the head and
neck will be undertaken in a year's time. The patient and her
daughter leave with good understanding and agreement with above
management plan.

## 2017-06-01 ENCOUNTER — Institutional Professional Consult (permissible substitution): Payer: Medicare Other | Admitting: Internal Medicine

## 2017-09-25 ENCOUNTER — Telehealth (HOSPITAL_COMMUNITY): Payer: Self-pay

## 2017-09-25 NOTE — Telephone Encounter (Signed)
Called to schedule 2 yr f/u cta head/neck, no answer, left message for pt to return call. AW

## 2018-01-20 ENCOUNTER — Telehealth (HOSPITAL_COMMUNITY): Payer: Self-pay

## 2018-01-20 NOTE — Telephone Encounter (Signed)
Called to schedule 2 yr f/u cta head/neck, no answer, left vm. AW
